# Patient Record
Sex: Female | Born: 1975 | Race: White | Hispanic: No | Marital: Married | Smoking: Current every day smoker
Health system: Southern US, Community
[De-identification: ages and names within clinical notes are randomized; demographics above are authoritative.]

## PROBLEM LIST (undated history)

## (undated) DIAGNOSIS — F909 Attention-deficit hyperactivity disorder, unspecified type: Secondary | ICD-10-CM

## (undated) DIAGNOSIS — J45909 Unspecified asthma, uncomplicated: Secondary | ICD-10-CM

## (undated) DIAGNOSIS — F840 Autistic disorder: Secondary | ICD-10-CM

## (undated) DIAGNOSIS — F419 Anxiety disorder, unspecified: Secondary | ICD-10-CM

## (undated) HISTORY — DX: Attention-deficit hyperactivity disorder, unspecified type: F90.9

## (undated) HISTORY — PX: LEG SURGERY: SHX1003

## (undated) HISTORY — DX: Anxiety disorder, unspecified: F41.9

## (undated) HISTORY — PX: CHOLECYSTECTOMY: SHX55

## (undated) HISTORY — DX: Autistic disorder: F84.0

## (undated) HISTORY — DX: Unspecified asthma, uncomplicated: J45.909

---

## 2020-06-03 ENCOUNTER — Encounter (HOSPITAL_BASED_OUTPATIENT_CLINIC_OR_DEPARTMENT_OTHER): Payer: Self-pay

## 2020-06-03 ENCOUNTER — Other Ambulatory Visit: Payer: Self-pay

## 2020-06-03 ENCOUNTER — Emergency Department (HOSPITAL_BASED_OUTPATIENT_CLINIC_OR_DEPARTMENT_OTHER)
Admission: EM | Admit: 2020-06-03 | Discharge: 2020-06-04 | Disposition: A | Discharge status: Home / Self Care | Payer: HRSA Program | Attending: Internal Medicine | Admitting: Internal Medicine

## 2020-06-03 ENCOUNTER — Emergency Department (HOSPITAL_BASED_OUTPATIENT_CLINIC_OR_DEPARTMENT_OTHER): Payer: HRSA Program

## 2020-06-03 DIAGNOSIS — U071 COVID-19: Secondary | ICD-10-CM | POA: Diagnosis present

## 2020-06-03 DIAGNOSIS — R0602 Shortness of breath: Secondary | ICD-10-CM | POA: Diagnosis present

## 2020-06-03 DIAGNOSIS — F17210 Nicotine dependence, cigarettes, uncomplicated: Secondary | ICD-10-CM | POA: Diagnosis not present

## 2020-06-03 DIAGNOSIS — J4541 Moderate persistent asthma with (acute) exacerbation: Secondary | ICD-10-CM | POA: Insufficient documentation

## 2020-06-03 HISTORY — DX: Attention-deficit hyperactivity disorder, unspecified type: F90.9

## 2020-06-03 HISTORY — DX: Unspecified asthma, uncomplicated: J45.909

## 2020-06-03 HISTORY — DX: Anxiety disorder, unspecified: F41.9

## 2020-06-03 HISTORY — DX: Autistic disorder: F84.0

## 2020-06-03 LAB — CBC WITH DIFFERENTIAL/PLATELET
Abs Immature Granulocytes: 0 10*3/uL (ref 0.00–0.07)
Basophils Absolute: 0 10*3/uL (ref 0.0–0.1)
Basophils Relative: 1 %
Eosinophils Absolute: 0.1 10*3/uL (ref 0.0–0.5)
Eosinophils Relative: 4 %
HCT: 38.5 % (ref 36.0–46.0)
Hemoglobin: 11.8 g/dL — ABNORMAL LOW (ref 12.0–15.0)
Immature Granulocytes: 0 %
Lymphocytes Relative: 39 %
Lymphs Abs: 1.1 10*3/uL (ref 0.7–4.0)
MCH: 24.4 pg — ABNORMAL LOW (ref 26.0–34.0)
MCHC: 30.6 g/dL (ref 30.0–36.0)
MCV: 79.5 fL — ABNORMAL LOW (ref 80.0–100.0)
Monocytes Absolute: 0.3 10*3/uL (ref 0.1–1.0)
Monocytes Relative: 9 %
Neutro Abs: 1.3 10*3/uL — ABNORMAL LOW (ref 1.7–7.7)
Neutrophils Relative %: 47 %
Platelets: 242 10*3/uL (ref 150–400)
RBC: 4.84 MIL/uL (ref 3.87–5.11)
RDW: 18.3 % — ABNORMAL HIGH (ref 11.5–15.5)
WBC: 2.8 10*3/uL — ABNORMAL LOW (ref 4.0–10.5)
nRBC: 0 % (ref 0.0–0.2)

## 2020-06-03 LAB — COMPREHENSIVE METABOLIC PANEL
ALT: 26 U/L (ref 0–44)
AST: 27 U/L (ref 15–41)
Albumin: 3.7 g/dL (ref 3.5–5.0)
Alkaline Phosphatase: 54 U/L (ref 38–126)
Anion gap: 8 (ref 5–15)
BUN: 14 mg/dL (ref 6–20)
CO2: 26 mmol/L (ref 22–32)
Calcium: 8.4 mg/dL — ABNORMAL LOW (ref 8.9–10.3)
Chloride: 104 mmol/L (ref 98–111)
Creatinine, Ser: 0.82 mg/dL (ref 0.44–1.00)
GFR calc Af Amer: >60 mL/min (ref >60)
GFR calc non Af Amer: >60 mL/min (ref >60)
Glucose, Bld: 91 mg/dL (ref 70–99)
Potassium: 4.2 mmol/L (ref 3.5–5.1)
Sodium: 138 mmol/L (ref 135–145)
Total Bilirubin: 0.2 mg/dL — ABNORMAL LOW (ref 0.3–1.2)
Total Protein: 7.1 g/dL (ref 6.5–8.1)

## 2020-06-03 LAB — TRIGLYCERIDES: Triglycerides: 101 mg/dL (ref <150)

## 2020-06-03 LAB — FIBRINOGEN: Fibrinogen: 367 mg/dL (ref 210–475)

## 2020-06-03 LAB — LACTATE DEHYDROGENASE: LDH: 156 U/L (ref 98–192)

## 2020-06-03 LAB — C-REACTIVE PROTEIN: CRP: 0.6 mg/dL (ref <1.0)

## 2020-06-03 LAB — FERRITIN: Ferritin: 6 ng/mL — ABNORMAL LOW (ref 11–307)

## 2020-06-03 LAB — LACTIC ACID, PLASMA: Lactic Acid, Venous: 0.6 mmol/L (ref 0.5–1.9)

## 2020-06-03 LAB — PROCALCITONIN: Procalcitonin: <0.1 ng/mL

## 2020-06-03 LAB — PREGNANCY, URINE: Preg Test, Ur: NEGATIVE

## 2020-06-03 LAB — SARS CORONAVIRUS 2 BY RT PCR (HOSPITAL ORDER, PERFORMED IN ~~LOC~~ HOSPITAL LAB): SARS Coronavirus 2: POSITIVE — AB

## 2020-06-03 LAB — D-DIMER, QUANTITATIVE: D-Dimer, Quant: <0.27 ug/mL-FEU (ref 0.00–0.50)

## 2020-06-03 MED ORDER — SODIUM CHLORIDE 0.9 % IV SOLN
100.0000 mg | INTRAVENOUS | Status: AC
  Administered 2020-06-03 – 2020-06-04 (×2): 100 mg via INTRAVENOUS
  Filled 2020-06-03: qty 20

## 2020-06-03 MED ORDER — IPRATROPIUM BROMIDE HFA 17 MCG/ACT IN AERS
2.0000 | INHALATION_SPRAY | Freq: Once | RESPIRATORY_TRACT | Status: AC
  Administered 2020-06-03: 2 via RESPIRATORY_TRACT

## 2020-06-03 MED ORDER — SODIUM CHLORIDE 0.9 % IV SOLN
100.0000 mg | Freq: Every day | INTRAVENOUS | Status: DC
  Administered 2020-06-04: 100 mg via INTRAVENOUS
  Filled 2020-06-03: qty 20

## 2020-06-03 MED ORDER — ALBUTEROL SULFATE HFA 108 (90 BASE) MCG/ACT IN AERS
4.0000 | INHALATION_SPRAY | Freq: Once | RESPIRATORY_TRACT | Status: AC
  Administered 2020-06-03: 4 via RESPIRATORY_TRACT

## 2020-06-03 MED ORDER — IPRATROPIUM BROMIDE HFA 17 MCG/ACT IN AERS
4.0000 | INHALATION_SPRAY | Freq: Once | RESPIRATORY_TRACT | Status: AC
  Administered 2020-06-03: 4 via RESPIRATORY_TRACT
  Filled 2020-06-03: qty 12.9

## 2020-06-03 MED ORDER — DEXAMETHASONE SODIUM PHOSPHATE 10 MG/ML IJ SOLN
10.0000 mg | Freq: Once | INTRAMUSCULAR | Status: AC
  Administered 2020-06-03: 10 mg via INTRAVENOUS
  Filled 2020-06-03: qty 1

## 2020-06-03 MED ORDER — ALBUTEROL SULFATE HFA 108 (90 BASE) MCG/ACT IN AERS
2.0000 | INHALATION_SPRAY | RESPIRATORY_TRACT | Status: DC | PRN
  Administered 2020-06-03 – 2020-06-04 (×2): 2 via RESPIRATORY_TRACT

## 2020-06-03 MED ORDER — ALBUTEROL SULFATE HFA 108 (90 BASE) MCG/ACT IN AERS
8.0000 | INHALATION_SPRAY | Freq: Once | RESPIRATORY_TRACT | Status: AC
  Administered 2020-06-03: 8 via RESPIRATORY_TRACT
  Filled 2020-06-03: qty 6.7

## 2020-06-03 NOTE — ED Provider Notes (Signed)
[facility] EMERGENCY DEPARTMENT Provider Note   CSN: [id number] Arrival date & time: 06/03/20  1943     History Chief Complaint  Patient presents with  . Shortness of Breath    Tina Wilkinson is a 44 y.o. female.  HPI Patient has history of asthma.  She developed fevers and body aches over the past 4 days.  Reports her temperature has been up to 102.  She has been achy all over.  She started coughing and became short of breath.  She reports she has started using her inhaler every hour without relief of shortness of breath.  She has not had vomiting but she has had diarrhea.  She reports she feels extremely fatigued and weak.  Patient has not had Covid immunization.  She reports she is from out of town and was hiking the [place].  He was trying to control fever with Tylenol but now body aches and headache and general weakness with shortness of breath have worsened and are not improving with treatment.    Past Medical History:  Diagnosis Date  . ADHD   . Anxiety   . Asthma   . Autism     There are no problems to display for this patient.   Past Surgical History:  Procedure Laterality Date  . CHOLECYSTECTOMY    . LEG SURGERY       OB History   No obstetric history on file.     No family history on file.  Social History   Tobacco Use  . Smoking status: Current Every Day Smoker    Types: Cigarettes  . Smokeless tobacco: Never Used  Substance Use Topics  . Alcohol use: Never  . Drug use: Never    Home Medications Prior to Admission medications   Not on File    Allergies    Patient has no known allergies.  Review of Systems   Review of Systems 10 Systems reviewed and negative except as per HPI Physical Exam Updated Vital Signs BP 129/85   Pulse 77   Temp 98.9 F (37.2 C) (Oral)   Resp (!) 25   LMP 05/03/2020 (Approximate)   SpO2 96%   Physical Exam Constitutional:      Comments: Patient has moderate to severe dyspnea.  Until  status is clear.  Speaking in short sentences.  HENT:     Head: Normocephalic and atraumatic.     Mouth/Throat:     Pharynx: Oropharynx is clear.  Eyes:     Extraocular Movements: Extraocular movements intact.  Cardiovascular:     Rate and Rhythm: Normal rate and regular rhythm.  Pulmonary:     Comments: Moderate increased work of breathing.  Wheezing diffuse and coarse inspiratory and expiratory throughout lung fields. Abdominal:     General: There is no distension.     Palpations: Abdomen is soft.     Tenderness: There is no abdominal tenderness. There is no guarding.  Musculoskeletal:        General: No swelling or tenderness. Normal range of motion.     Cervical back: Neck supple.     Right lower leg: No edema.     Left lower leg: No edema.  Skin:    General: Skin is warm and dry.  Neurological:     General: No focal deficit present.     Mental Status: She is oriented to person, place, and time.     Coordination: Coordination normal.  Psychiatric:  Mood and Affect: Mood normal.     ED Results / Procedures / Treatments   Labs (all labs ordered are listed, but only abnormal results are displayed) Labs Reviewed  SARS CORONAVIRUS 2 BY RT PCR (HOSPITAL ORDER, PERFORMED IN [facility]) - Abnormal; Notable for the following components:      Result Value   SARS Coronavirus 2 POSITIVE (*)    All other components within normal limits  CBC WITH DIFFERENTIAL/PLATELET - Abnormal; Notable for the following components:   WBC 2.8 (*)    Hemoglobin 11.8 (*)    MCV 79.5 (*)    MCH 24.4 (*)    RDW 18.3 (*)    Neutro Abs 1.3 (*)    All other components within normal limits  COMPREHENSIVE METABOLIC PANEL - Abnormal; Notable for the following components:   Calcium 8.4 (*)    Total Bilirubin 0.2 (*)    All other components within normal limits  CULTURE, BLOOD (ROUTINE X 2)  CULTURE, BLOOD (ROUTINE X 2)  LACTIC ACID, PLASMA  D-DIMER, QUANTITATIVE (NOT AT [facility])    PROCALCITONIN  LACTATE DEHYDROGENASE  TRIGLYCERIDES  LACTIC ACID, PLASMA  FERRITIN  FIBRINOGEN  C-REACTIVE PROTEIN  PREGNANCY, URINE    EKG EKG Interpretation  Date/Time:  Tuesday June 03 2020 20:30:20 EDT Ventricular Rate:  76 PR Interval:    QRS Duration: 95 QT Interval:  412 QTC Calculation: 464 R Axis:   4 Text Interpretation: Sinus rhythm Low voltage, precordial leads normal, no old comparison Confirmed by Arby Barrette [phone]) on 06/03/2020 10:59:21 PM   Radiology DG Chest Port 1 View  Result Date: 06/03/2020 CLINICAL DATA:  Shortness of breath fever EXAM: PORTABLE CHEST 1 VIEW COMPARISON:  None. FINDINGS: The heart size and mediastinal contours are within normal limits. Both lungs are clear. The visualized skeletal structures are unremarkable. IMPRESSION: No active disease. Electronically Signed   By: Jasmine Pang M.D.   On: 06/03/2020 20:59    Procedures Procedures (including critical care time) CRITICAL CARE Performed by: Arby Barrette   Total critical care time: 33 minutes  Critical care time was exclusive of separately billable procedures and treating other patients.  Critical care was necessary to treat or prevent imminent or life-threatening deterioration.  Critical care was time spent personally by me on the following activities: development of treatment plan with patient and/or surrogate as well as nursing, discussions with consultants, evaluation of patient's response to treatment, examination of patient, obtaining history from patient or surrogate, ordering and performing treatments and interventions, ordering and review of laboratory studies, ordering and review of radiographic studies, pulse oximetry and re-evaluation of patient's condition. Medications Ordered in ED Medications  dexamethasone (DECADRON) injection 10 mg (has no administration in time range)  albuterol (VENTOLIN HFA) 108 (90 Base) MCG/ACT inhaler 2 puff (has no administration  in time range)  albuterol (VENTOLIN HFA) 108 (90 Base) MCG/ACT inhaler 8 puff (8 puffs Inhalation Given 06/03/20 2011)  ipratropium (ATROVENT HFA) inhaler 4 puff (4 puffs Inhalation Given 06/03/20 2011)  albuterol (VENTOLIN HFA) 108 (90 Base) MCG/ACT inhaler 4 puff (4 puffs Inhalation Given 06/03/20 2219)  ipratropium (ATROVENT HFA) inhaler 2 puff (2 puffs Inhalation Given 06/03/20 2219)    ED Course  I have reviewed the triage vital signs and the nursing notes.  Pertinent labs & imaging results that were available during my care of the patient were reviewed by me and considered in my medical decision making (see chart for details).    MDM Rules/Calculators/A&P  Patient presents with moderate to severe respiratory distress.  Wheezing throughout all lung fields with history of asthma.  Patient is Covid positive.  She has been treated with multiple albuterol inhaler and Atrovent treatments.  This has improved her respiratory distress.  Patient is now much more calm and speaking in full sentences.  He does continue to have coarse and diffuse inspiratory and expiratory wheeze throughout lung fields.  Mental status is clear.  At this time, plan will be to admit patient for persistent asthma exacerbation with Covid.  Tina Wilkinson was evaluated in Emergency Department on 06/03/2020 for the symptoms described in the history of present illness. She was evaluated in the context of the global COVID-19 pandemic, which necessitated consideration that the patient might be at risk for infection with the SARS-CoV-2 virus that causes COVID-19. Institutional protocols and algorithms that pertain to the evaluation of patients at risk for COVID-19 are in a state of rapid change based on information released by regulatory bodies including the CDC and federal and state organizations. These policies and algorithms were followed during the patient's care in the ED.   Final Clinical Impression(s) /  ED Diagnoses Final diagnoses:  Moderate persistent asthma with acute exacerbation  COVID-19    Rx / DC Orders ED Discharge Orders    None       Arby Barrette, MD 06/03/20 [phone]

## 2020-06-03 NOTE — Progress Notes (Signed)
RN gave patient albuterol treatment. 

## 2020-06-03 NOTE — ED Triage Notes (Signed)
Pt c/o cough, fever, SOB x 3 days-states she has hx of asthma-last albuterol 1 hour PTA

## 2020-06-03 NOTE — ED Notes (Signed)
Pt calm, non labored RR. Able to speak full sentences. VSS.

## 2020-06-03 NOTE — ED Notes (Signed)
Labored mouth breathing with exp wheezed, RT in to give pt albuterol inhaler. Oxygen sat. 98%. Pt able to speak short phrases. Airway intact.

## 2020-06-04 DIAGNOSIS — U071 COVID-19: Secondary | ICD-10-CM | POA: Diagnosis present

## 2020-06-04 LAB — LACTIC ACID, PLASMA: Lactic Acid, Venous: 1.8 mmol/L (ref 0.5–1.9)

## 2020-06-04 MED ORDER — KETOROLAC TROMETHAMINE 30 MG/ML IJ SOLN
30.0000 mg | Freq: Once | INTRAMUSCULAR | Status: AC
  Administered 2020-06-04: 30 mg via INTRAVENOUS
  Filled 2020-06-04: qty 1

## 2020-06-04 MED ORDER — PREDNISONE 20 MG PO TABS: 60.0000 mg | ORAL_TABLET | Freq: Every day | ORAL | 0 refills | Status: AC

## 2020-06-04 MED ORDER — DEXAMETHASONE SODIUM PHOSPHATE 10 MG/ML IJ SOLN
6.0000 mg | Freq: Once | INTRAMUSCULAR | Status: AC
  Administered 2020-06-04: 6 mg via INTRAVENOUS
  Filled 2020-06-04: qty 1

## 2020-06-04 NOTE — ED Notes (Signed)
Pt is very upset about the wait for admisssion.  Also upset regarding our department not having better food.  She states all we have is snacks.  Pt states no one has updated her regarding admission either.  Pt wanting to go home, states she does feel better.  Spoke to Dr. Charm Barges, he will speak to pt.

## 2020-06-04 NOTE — Discharge Instructions (Addendum)
Patient scheduled for outpatient Remdesivir infusions at 1pm on Thursday 9/16, Friday 9/17, Saturday 9/18, and Sunday 9/19 at [facility]. Please inform the patient to park at [address] [place], [place], as staff will be escorting the patient through the east entrance of the hospital. Appointments will take approximately 45 minutes.    There is a wave flag banner located near the entrance on [address]. Turn into this entrance and immediately turn left and park in 1 of the 5 designated Covid Infusion Parking spots. There is a phone number on the sign, please call and let the staff know what spot you are in and we will come out and get you. For questions call [phone].  Thanks.    You are seen in the emergency department for evaluation of worsening shortness of breath and body aches.  You were having an asthma exacerbation and also tested positive for Covid.  You were given steroids and remdesivir for this.  You were put in for an admission to the hospital but unfortunately there are no beds available.  You elected to go home to manage her symptoms.  We are providing you with a prescription for 3 more days of steroids.  Please continue to use your albuterol inhaler 2 puffs every 4 hours as needed.  Follow-up with the infusion center in the post Covid clinic.  Return to the emergency department for any worsening or concerning symptoms

## 2020-06-04 NOTE — ED Notes (Signed)
Pt on admission VS routine.

## 2020-06-04 NOTE — ED Notes (Signed)
Pt was given breadfast cereal, oatmeal, milk, orange juice, and teddy gram cracker

## 2020-06-04 NOTE — ED Provider Notes (Signed)
Patient was signed out to me by Dr. Daun Peacock.  44 year old female with asthma Covid being admitted. Physical Exam  BP 117/76   Pulse 75   Temp 98.6 F (37 C) (Oral)   Resp 18   LMP 05/03/2020 (Approximate)   SpO2 98%   Physical Exam  ED Course/Procedures     Procedures  MDM  I was asked to see the patient by the nurse.  She felt that she was better and did not need to be admitted to the hospital.  She has not been requiring oxygen.  Speaking in full sentences.  She is rather frustrated at being here for almost 18 hours and still without getting a bed on the main campus.  She feels she can manage her symptoms at home and can isolate there.  Her husband is coming at 4 PM to pick her up.  I think this is reasonable as she looks well and is breathing comfortably.  We will send her home on 4 more days of steroids.  We will give her contact information for post Covid clinic.  We will also give the information to the [facility] although I am not sure she qualifies after having received remdesivir.  Will allow them to sort through that.  Med clinic effectiveness said she would not be a candidate for antibody infusion after having had remdesivir.  They could continue the remdesivir as an outpatient at the infusion center.  They will reach out to the patient.  Patient updated on plan.  Also let her know about the post Covid clinic.       Terrilee Files, MD 06/04/20 [place]

## 2020-06-04 NOTE — ED Notes (Signed)
Pt was given breakfast a second time, the same items as the first time. RN Landis Gandy was informed

## 2020-06-04 NOTE — ED Notes (Signed)
Pt called RN to room , reports been hungry and needs toilet paper , requests to use the hall restroom. This RN explained that we can feed her out of what we have available since the facility  Doesn't have a cafeteria , also explained that she can't use the hall restroom due to her Covid infection. Pt is verbally aggressive to this RN and using offensives vocabulary. Pt will not let this nurse explain any further care plan due to her loudness and pressured tone . Charge Nurse called to room and is at bedside at this time.   Pt was offered breakfast x 2 earlier by staff as it is charted

## 2020-06-04 NOTE — Progress Notes (Signed)
Patient scheduled for outpatient Remdesivir infusions at 1pm on Thursday 9/16, Friday 9/17, Saturday 9/18, and Sunday 9/19 at [facility]. Please inform the patient to park at [address] [place], [place], as staff will be escorting the patient through the east entrance of the hospital. Appointments will take approximately 45 minutes.    There is a wave flag banner located near the entrance on [address]. Turn into this entrance and immediately turn left and park in 1 of the 5 designated Covid Infusion Parking spots. There is a phone number on the sign, please call and let the staff know what spot you are in and we will come out and get you. For questions call [phone].  Thanks.

## 2020-06-05 ENCOUNTER — Ambulatory Visit (HOSPITAL_COMMUNITY): Payer: Self-pay | Attending: Pulmonary Disease

## 2020-06-05 ENCOUNTER — Telehealth (HOSPITAL_COMMUNITY): Payer: Self-pay | Admitting: Nurse Practitioner

## 2020-06-05 DIAGNOSIS — U071 COVID-19: Secondary | ICD-10-CM

## 2020-06-05 NOTE — Telephone Encounter (Signed)
Called to Discuss with patient about Covid symptoms and the use of regeneron, a monoclonal antibody infusion for those with mild to moderate Covid symptoms and at a high risk of hospitalization.    Reviewed note from ER and patient received remdesivir in ER but ultimately declined admission. She is therefore not eligible for regeneron. She could however, continue remdesivir treatments outpatient. I sent message to remdesivir team to schedule. Patient didn't answer when I called however. Left message to return call.   Consuello Masse, DNP, AGNP-C [phone] (Infusion Center Hotline)

## 2020-06-06 ENCOUNTER — Inpatient Hospital Stay (HOSPITAL_COMMUNITY): Admit: 2020-06-06 | Payer: Self-pay

## 2020-06-07 ENCOUNTER — Ambulatory Visit (HOSPITAL_COMMUNITY): Payer: Self-pay

## 2020-06-08 ENCOUNTER — Ambulatory Visit (HOSPITAL_COMMUNITY): Payer: Self-pay

## 2020-06-09 LAB — CULTURE, BLOOD (ROUTINE X 2)
Culture: NO GROWTH
Culture: NO GROWTH
Special Requests: ADEQUATE
Special Requests: ADEQUATE

## 2022-02-10 IMAGING — DX DG CHEST 1V PORT
1 series · 1 of 1 positions shown · non-contrast
Comparison: None.

CLINICAL DATA: Shortness of breath fever

EXAM:
PORTABLE CHEST 1 VIEW

[chest ap]
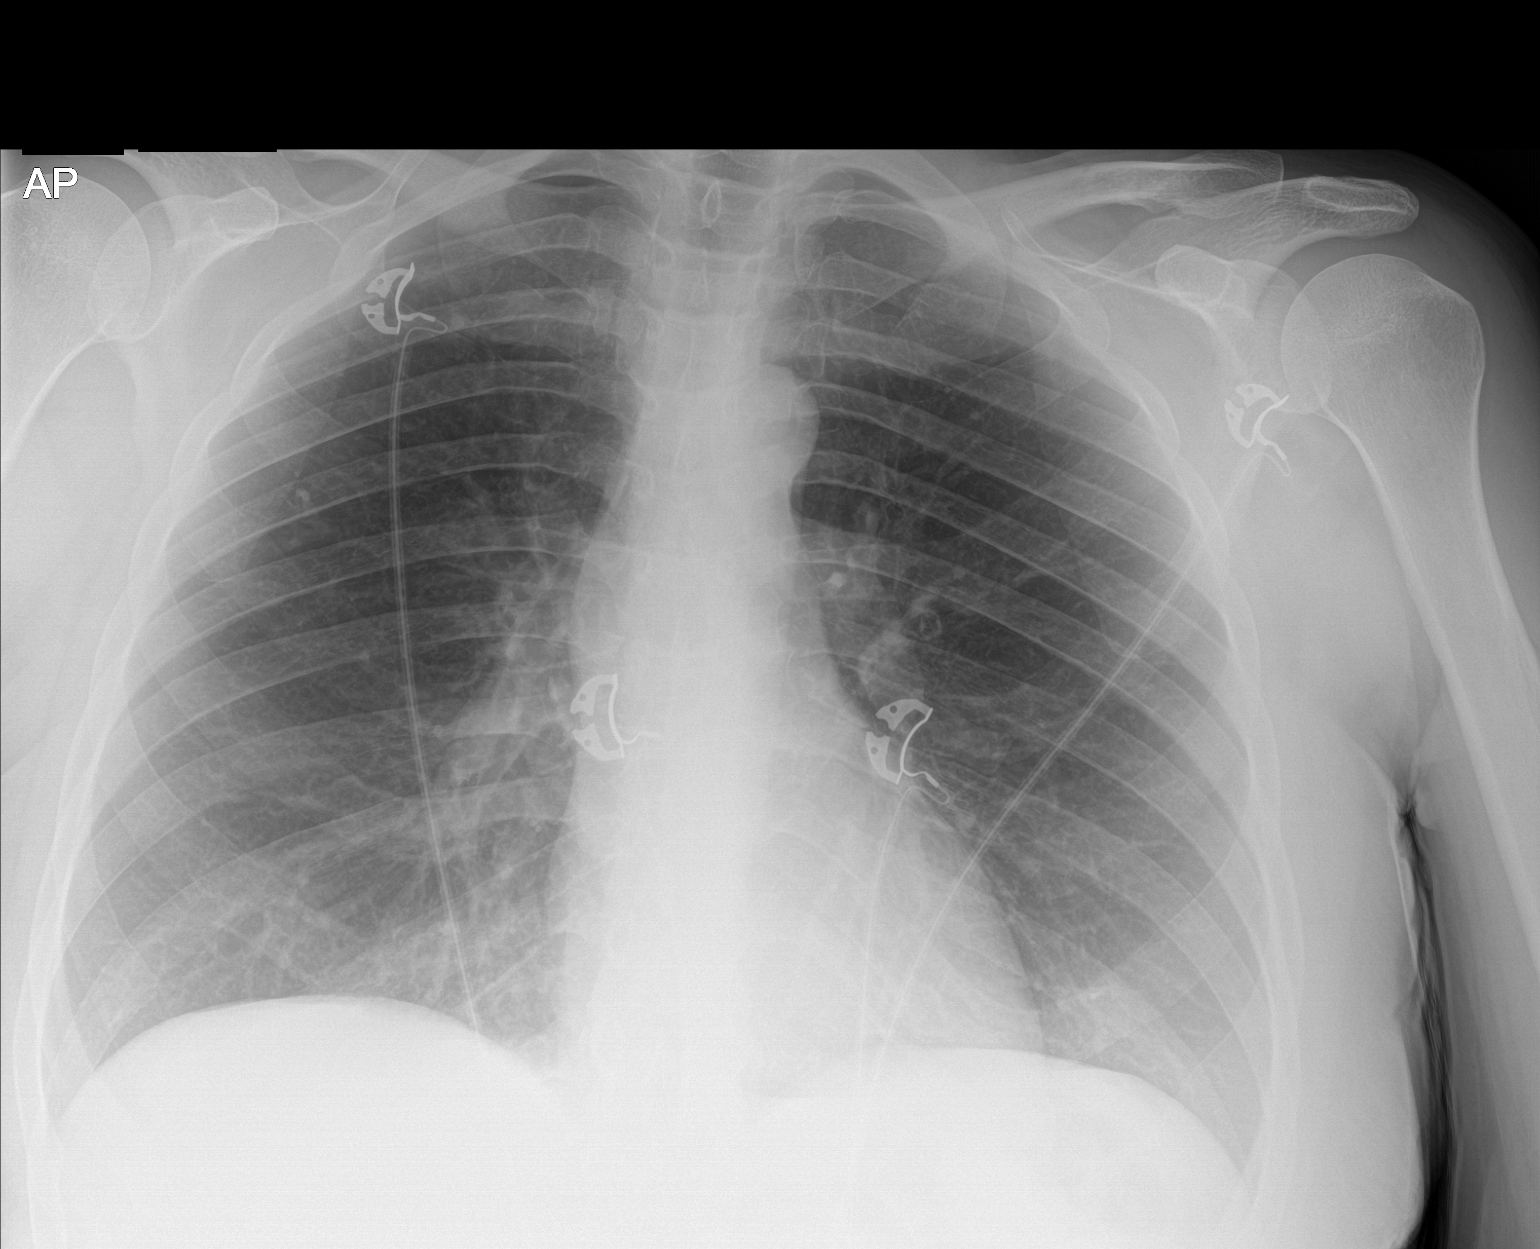

[1 of 1 positions shown; findings below may reference images not displayed]

FINDINGS: The heart size and mediastinal contours are within normal limits.
Both lungs are clear. The visualized skeletal structures are
unremarkable.
IMPRESSION: No active disease.
